# Patient Record
Sex: Male | Born: 1965 | Race: White | Hispanic: No | Marital: Married | Smoking: Current every day smoker
Health system: Southern US, Academic
[De-identification: ages and names within clinical notes are randomized; demographics above are authoritative.]

## PROBLEM LIST (undated history)

## (undated) DIAGNOSIS — E785 Hyperlipidemia, unspecified: Secondary | ICD-10-CM

## (undated) DIAGNOSIS — J449 Chronic obstructive pulmonary disease, unspecified: Secondary | ICD-10-CM

## (undated) DIAGNOSIS — I10 Essential (primary) hypertension: Secondary | ICD-10-CM

## (undated) HISTORY — PX: NO PAST SURGERIES: SHX2092

---

## 2021-11-11 IMAGING — CT LUNG CANCER SCREENING
2 of 4 series · 15 of 36 positions shown, 18 images · non-contrast
Comparison: None available.

﻿EXAM:  LUNG CANCER SCREENING
INDICATION: Lung cancer screening.  Thirty year smoking history.
TECHNIQUE: Low-dose helical noncontrast CT imaging of the chest was performed. Images were reviewed in multiple windows and projections. Exam was performed using 1 or more of the following dose reduction techniques: Automated exposure control, adjustment of the mA and/or kV according to patient size, or the use of iterative reconstruction technique.

[cor · coronal · 0.79mm/px · 3 of 73 slices shown]
[im 15/73  lung]
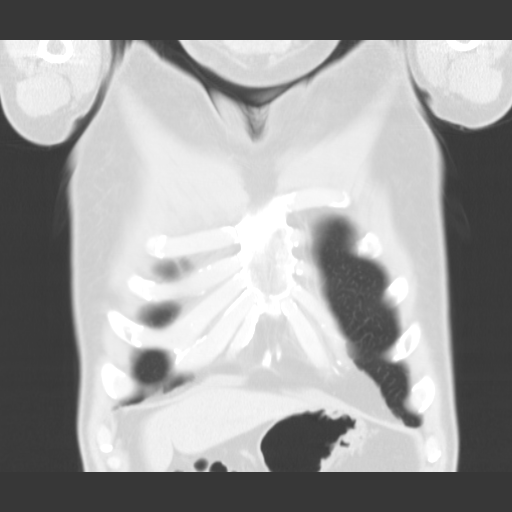
[im 29/73  lung]
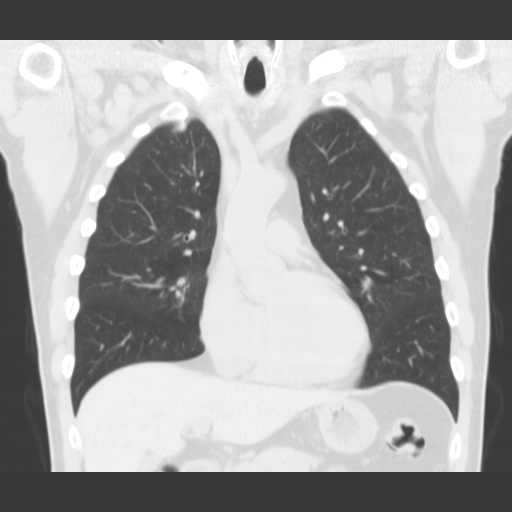
[im 44/73  lung]
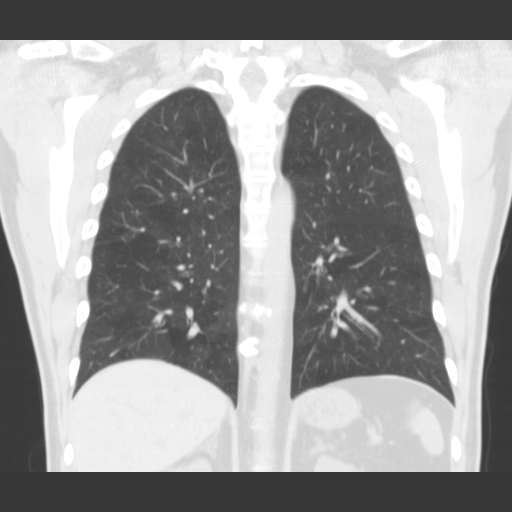

[lung · axial · 0.79mm/px · z∈[-101,+197]mm · 12 of 171 slices shown, 15 images]
[im 11/171  mediastinal]
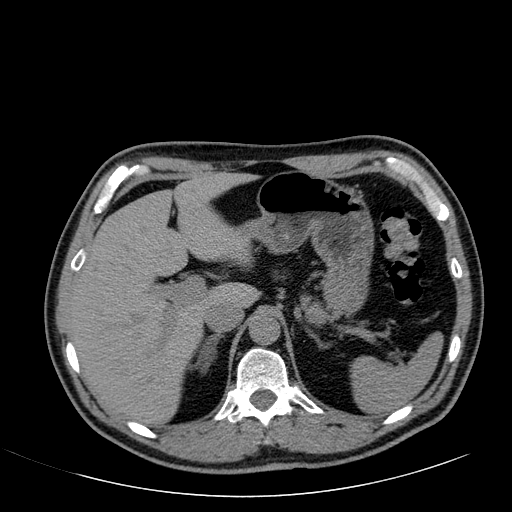
[im 11/171  lung]
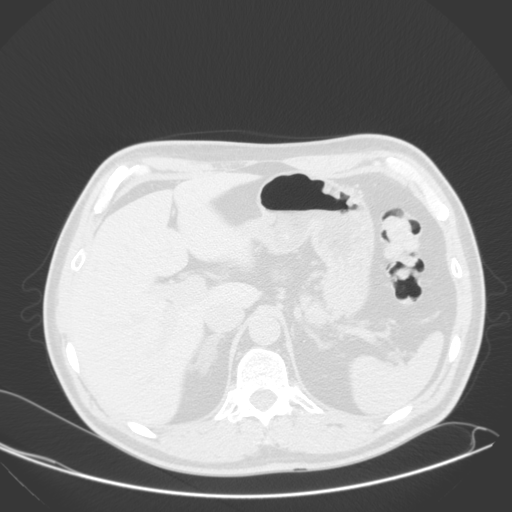
[im 22/171  lung]
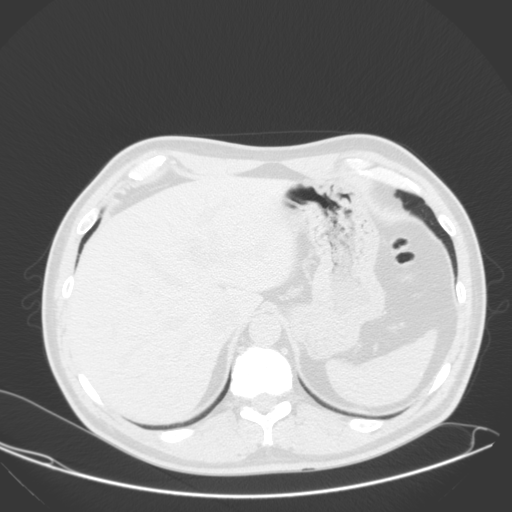
[im 43/171  lung]
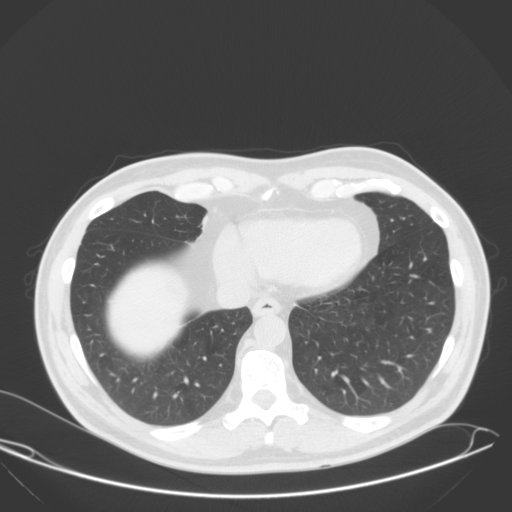
[im 54/171  lung]
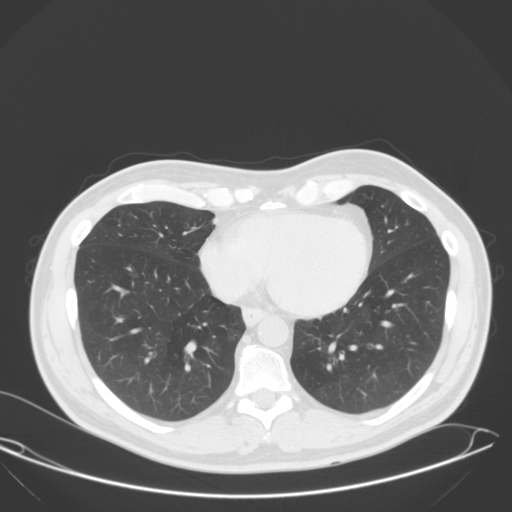
[im 64/171  mediastinal]
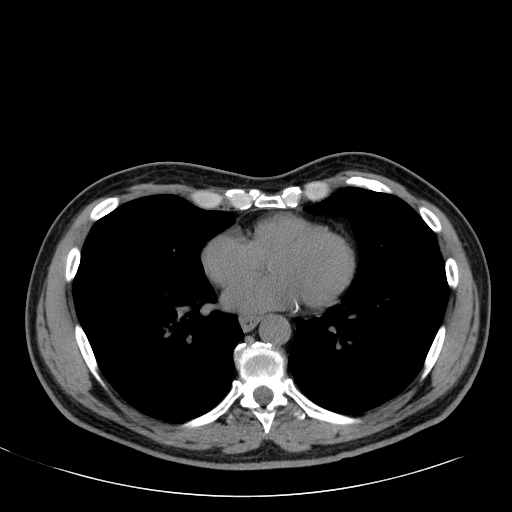
[im 64/171  lung]
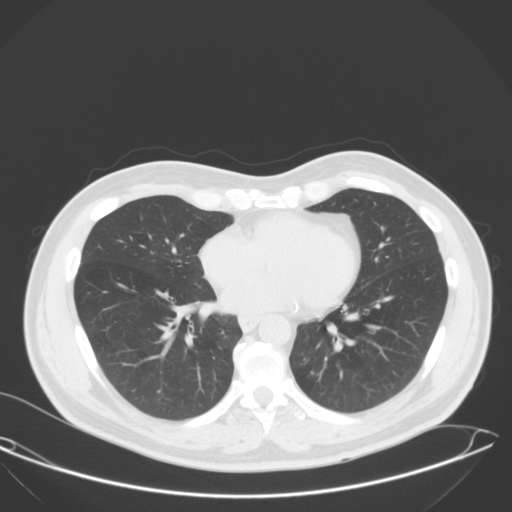
[im 75/171  lung]
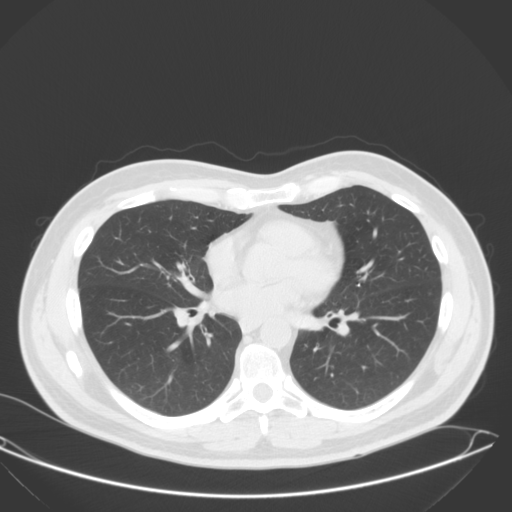
[im 96/171  lung]
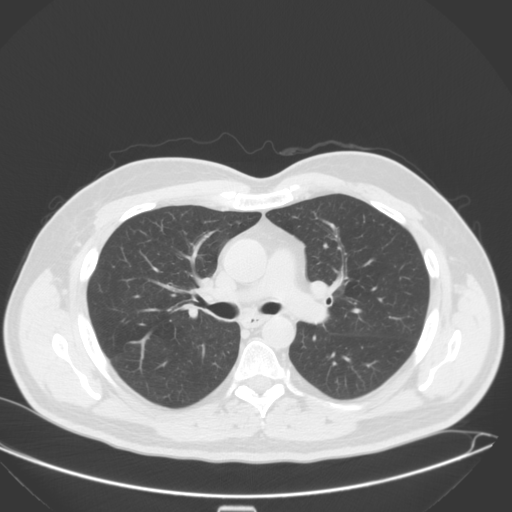
[im 107/171  lung]
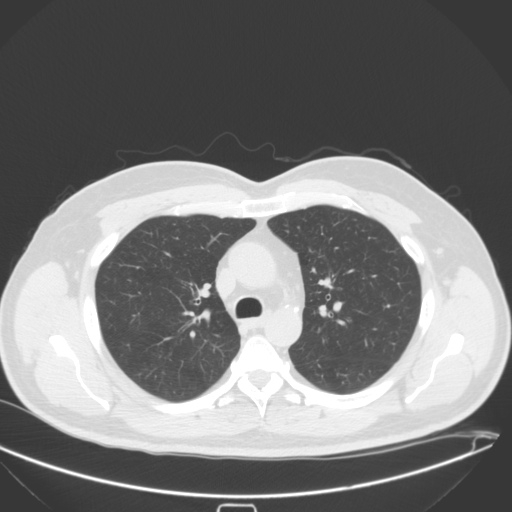
[im 117/171  mediastinal]
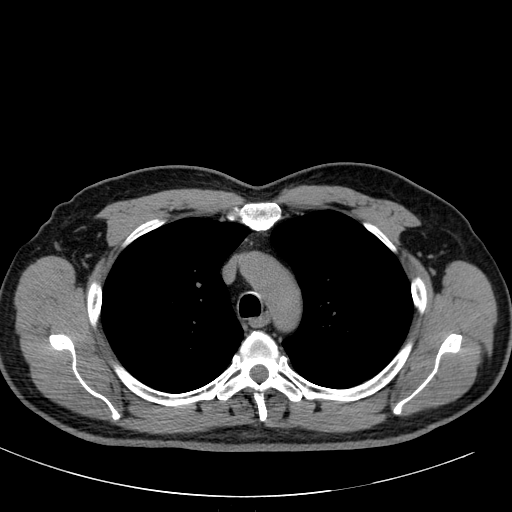
[im 117/171  lung]
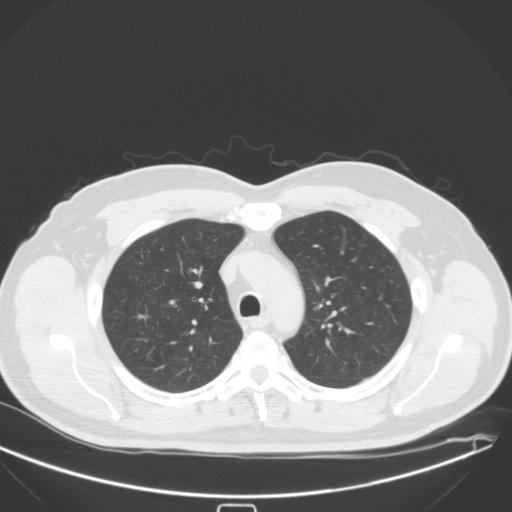
[im 128/171  lung]
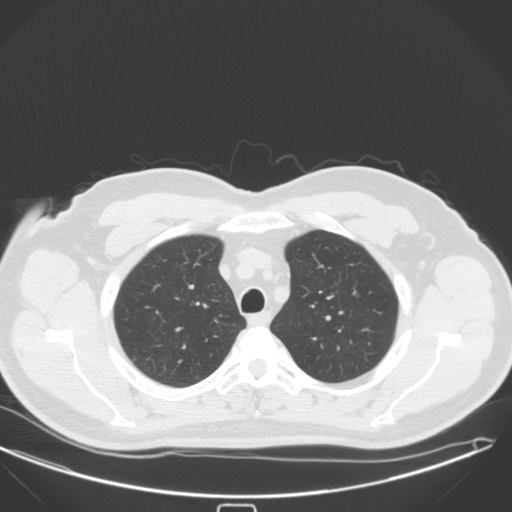
[im 149/171  lung]
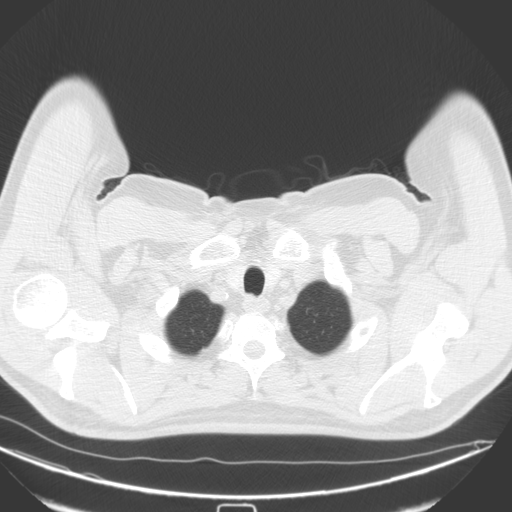
[im 160/171  lung]
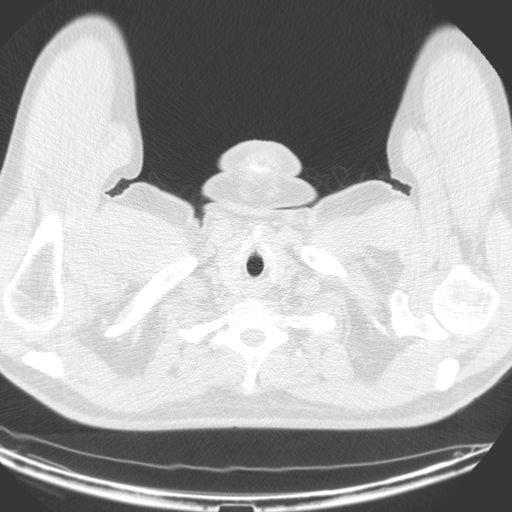

[15 of 36 positions shown; findings below may reference images not displayed]

FINDINGS: Visualized thyroid gland appears unremarkable. Trachea and mainstem bronchi are patent.  There is no mediastinal or axillary adenopathy. Evaluation for hilar adenopathy is limited due to the lack of intravenous contrast.  There are no significant emphysematous changes within the lungs.  There is no suspicious lung nodule.  There is no pleural or pericardial effusion.  There is no lung consolidation.  There is no aortic aneurysm. There are scattered coronary artery calcifications.  There is thickening of the adrenal glands.  Visualized osseous structures are unremarkable.
IMPRESSION: Lung rads 1-negative exam. Repeat low-dose chest CT scan is recommended in 12 months for reassessment.

## 2022-08-02 IMAGING — MR MRI LUMBAR SPINE WITHOUT CONTRAST
5 of 6 series · 31 of 48 positions shown · IV contrast (gadolinium)
Comparison: None available.

﻿EXAM:  [id number]   MRI LUMBAR SPINE WITHOUT CONTRAST
INDICATION: 56-year-old with history of low back pain.  Radicular symptoms to left leg with numbness.  No prior back surgery.
TECHNIQUE: Multiplanar, multisequential MRI of the lumbosacral spine was performed without gadolinium contrast.

[Series 5: T2 · sagittal · 4.0mm · 0.94mm/px · 6 of 15 slices shown (1 of 3)]
[im 1/15]
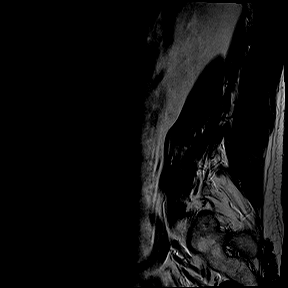
[im 3/15]
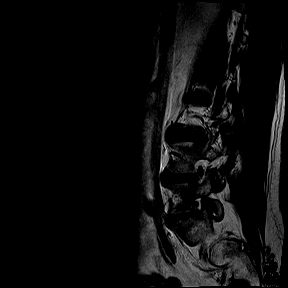
[im 6/15]
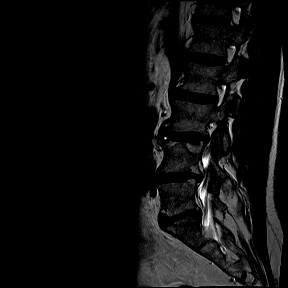
[im 9/15]
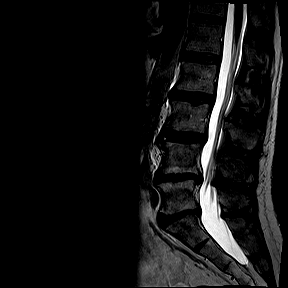
[im 12/15]
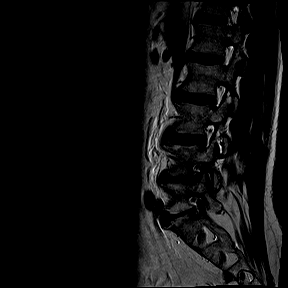
[im 15/15]
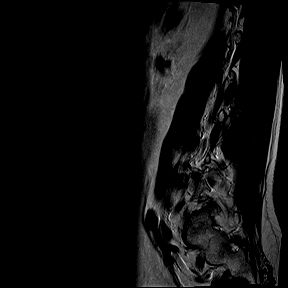

[Series 6: T1 · sagittal · 4.0mm · 0.94mm/px · 7 of 15 slices shown (1 of 2)]
[im 1/15]
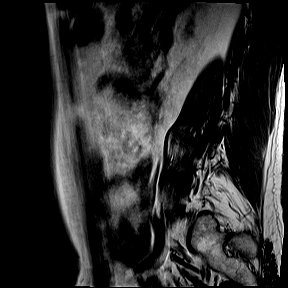
[im 3/15]
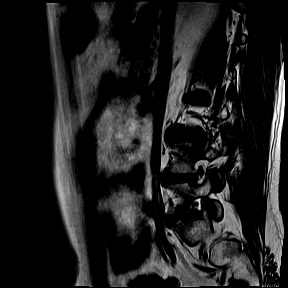
[im 5/15]
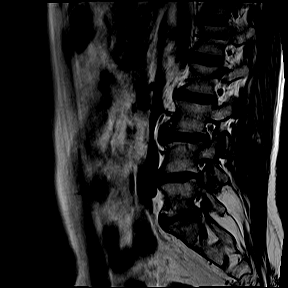
[im 8/15]
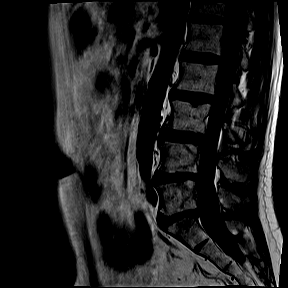
[im 10/15]
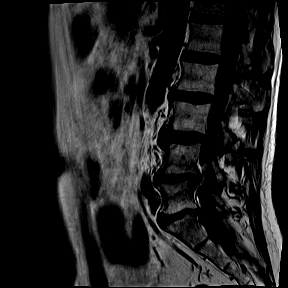
[im 12/15]
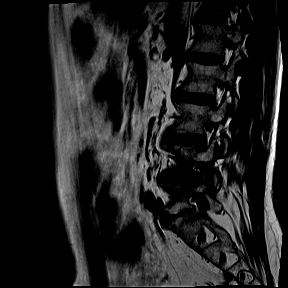
[im 15/15]
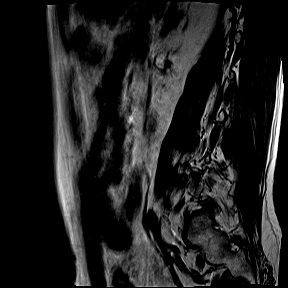

[Series 8: T2 · axial · 4.0mm · 0.52mm/px · z∈[-96,+99]mm · 8 of 23 slices shown (2 of 3)]
[im 1/23]
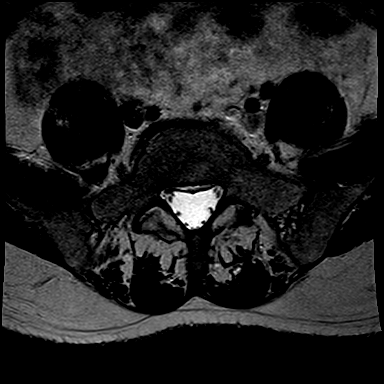
[im 3/23]
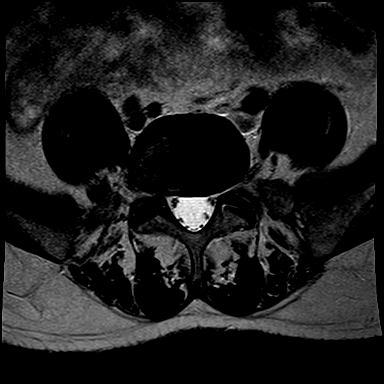
[im 8/23]
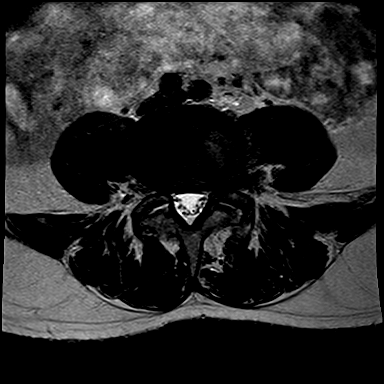
[im 10/23]
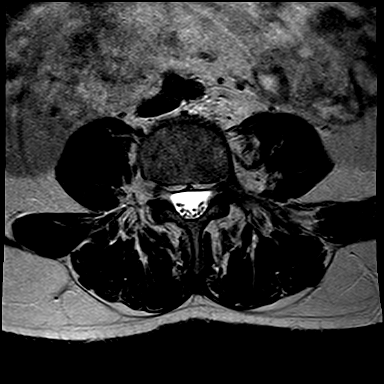
[im 13/23]
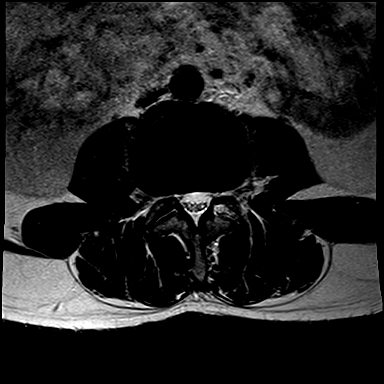
[im 15/23]
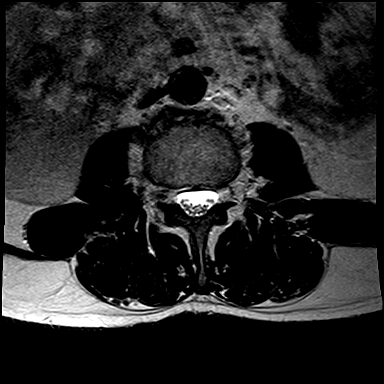
[im 20/23]
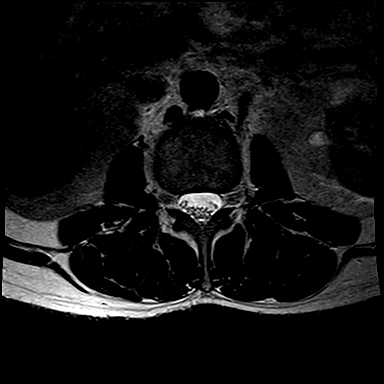
[im 23/23]
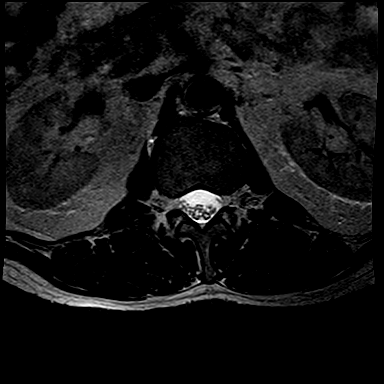

[Series 9: T1 · axial · 4.0mm · 0.52mm/px · z∈[-96,-86]mm · 2 of 23 slices shown (2 of 2)]
[im 1/23]
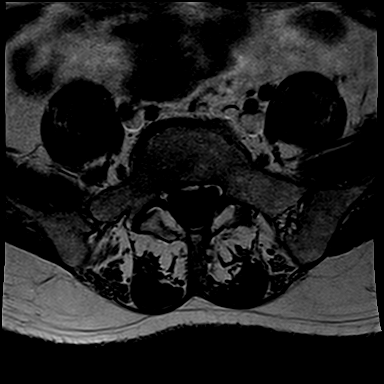
[im 3/23]
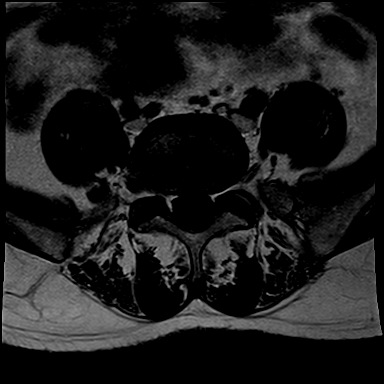

[Series 10: T2 · coronal · 5.0mm · 0.82mm/px · 8 of 18 slices shown (3 of 3)]
[im 1/18]
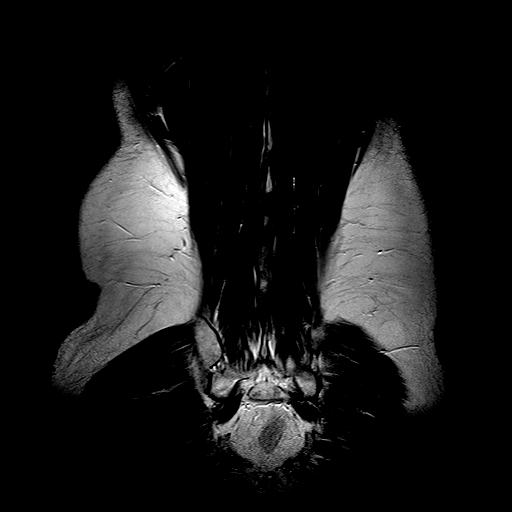
[im 3/18]
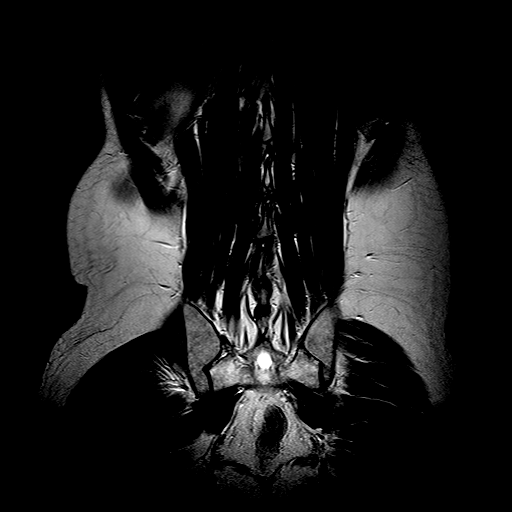
[im 5/18]
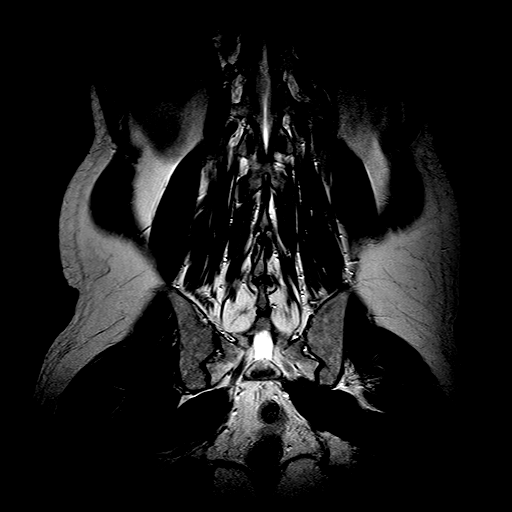
[im 8/18]
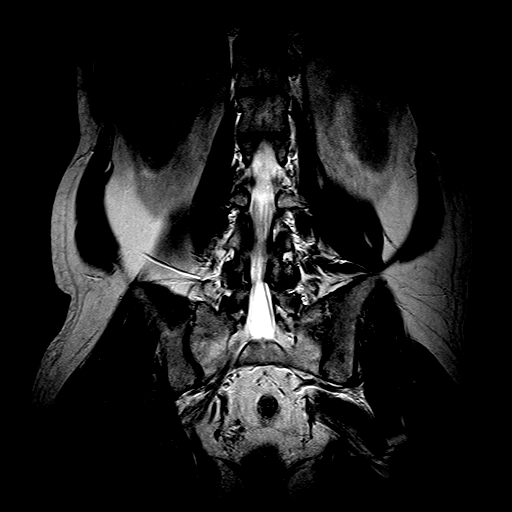
[im 10/18]
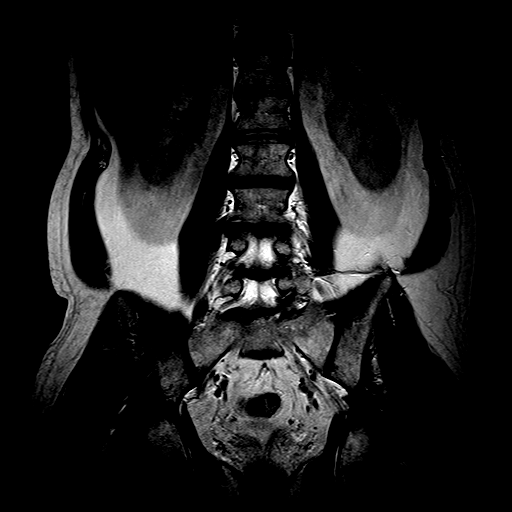
[im 13/18]
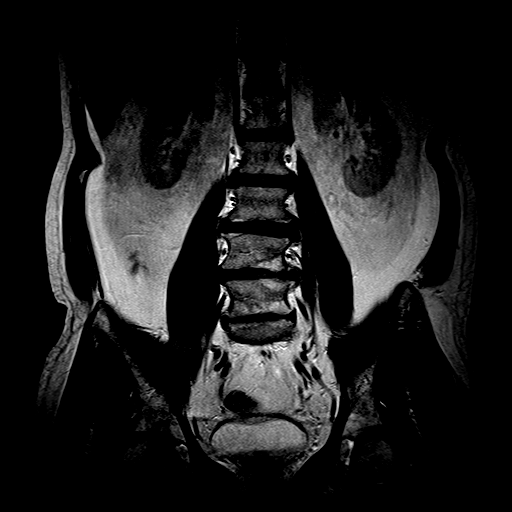
[im 15/18]
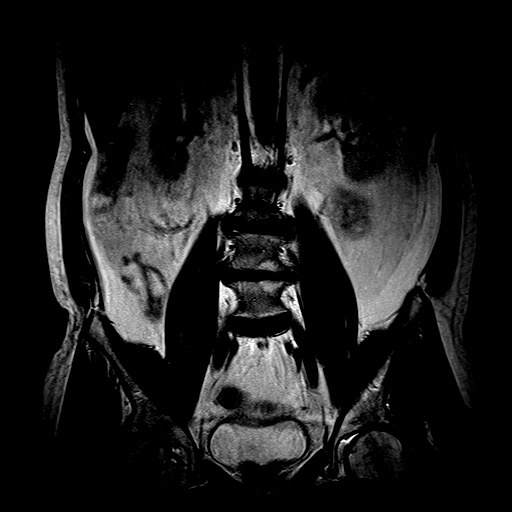
[im 18/18]
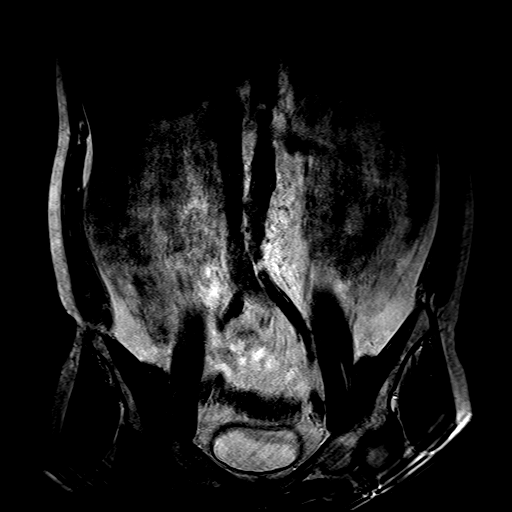

[31 of 48 positions shown; findings below may reference images not displayed]

FINDINGS: No acute bony lesions of lumbar vertebrae are seen.  Lower spinal cord and cauda equina are normal.

L1-2 disc shows no focal lesions.

At L2-3 level, degenerative disc disease and facet arthropathy are causing mild compromise of both lateral recesses.

At L3-4 level, significant bilateral facet arthropathy, left more than the right along with degenerative disc disease with bulging annulus are causing significant compromise of neural foramina, right more than the left and mild compromise of thecal sac in the midline with AP diameter measuring 12.7 mm. 

At L4-5 level, significant facet arthropathy and degenerative disc changes are noted with minimal retrolisthesis.  Significant biforaminal stenosis is noted, left worse than the right.  Mild compromise of thecal sac in the midline with AP diameter measuring 11.7 mm.

At L5-S1 level, degenerative disc disease and facet arthropathy are causing moderately significant biforaminal stenosis. 

Paravertebral soft tissues are unremarkable.
IMPRESSION: 1.  No acute bone changes of lumbar vertebrae.

2.  At L3-4 level, significant bilateral facet arthropathy, left more than the right along with degenerative disc disease with bulging annulus are causing significant compromise of neural foramina, right more than the left and mild compromise of thecal sac in the midline with AP diameter measuring 12.7 mm. 

3. At L4-5 level, significant facet arthropathy and degenerative disc changes are noted with minimal retrolisthesis.  Significant biforaminal stenosis is noted, left worse than the right.  Mild compromise of thecal sac in the midline with AP diameter measuring 11.7 mm.

4. Findings at other disc levels are described above in detail.

Electronically Signed by VIEDEHOF, ELSAMOWAL at [id number] [DATE]

## 2022-12-01 IMAGING — MR MRI SHOULDER RT W/O CONTRAST
6 of 7 series · 29 of 40 positions shown · IV contrast (gadolinium)
Comparison: None available.

﻿EXAM:  [id number]   MRI SHOULDER RT W/O CONTRAST
INDICATION: 56-year-old with persistent right shoulder pain and diminished range of motion.  Sustained trauma loading heavy object.  No prior surgery.
TECHNIQUE: Multiplanar multisequential MRI of the right shoulder was performed without gadolinium contrast.

[Series 6: T1 · oblique · right · 4.0mm · 0.31mm/px · 5 of 22 slices shown]
[im 1/22]
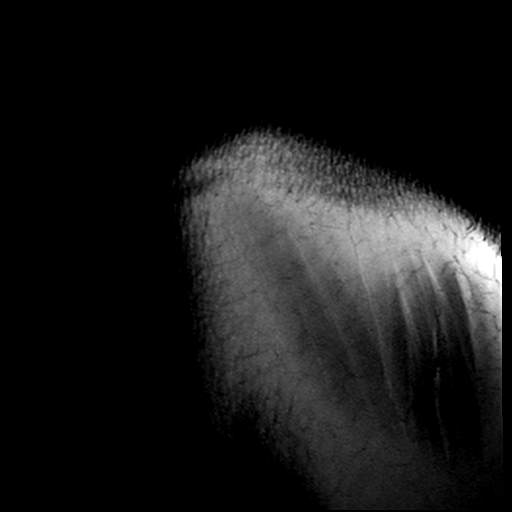
[im 6/22]
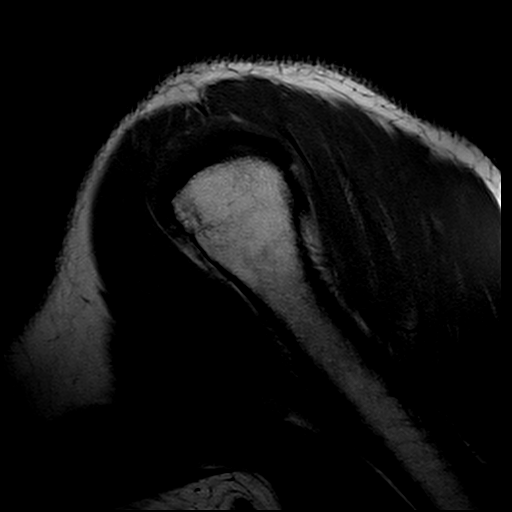
[im 11/22]
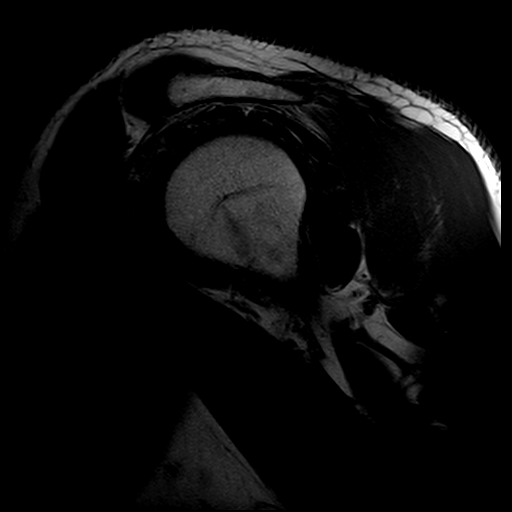
[im 16/22]
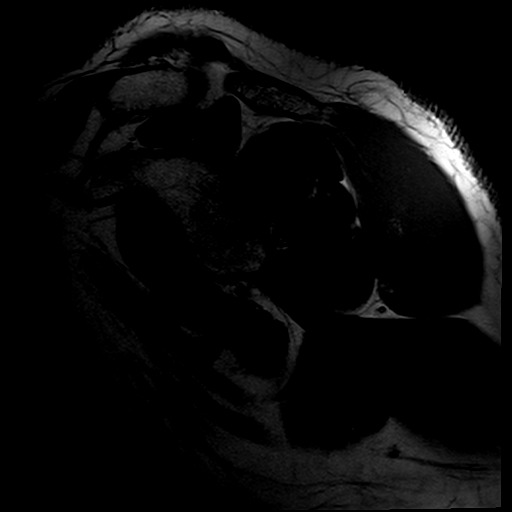
[im 22/22]
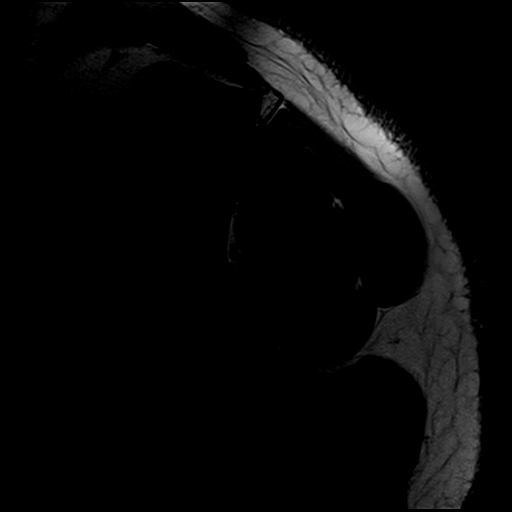

[Series 7: T2 · oblique · right · 4.0mm · 0.42mm/px · 5 of 22 slices shown]
[im 1/22]
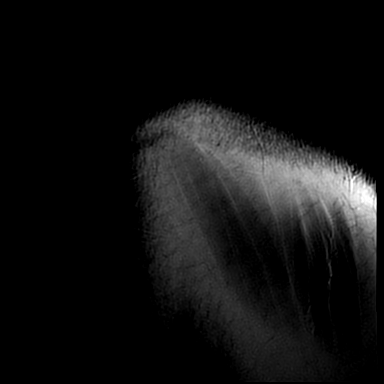
[im 6/22]
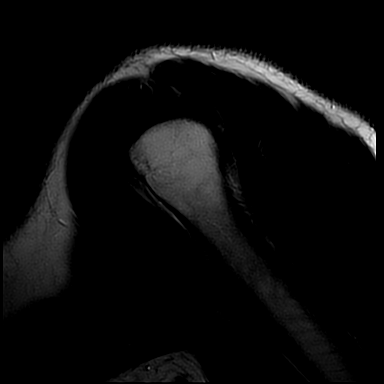
[im 11/22]
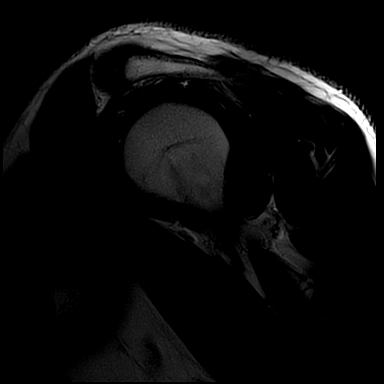
[im 16/22]
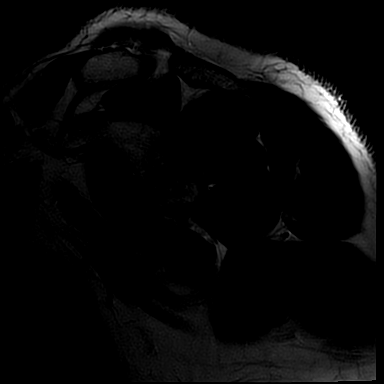
[im 22/22]
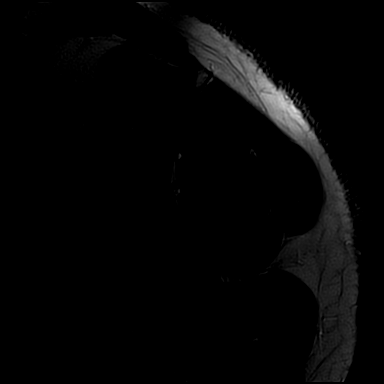

[Series 8: STIR · oblique · right · 4.0mm · 0.36mm/px · 1 of 22 slices shown]
[im 1/22]
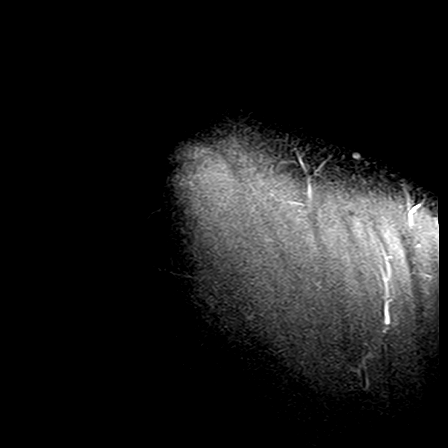

[Series 9: PD fat-sat · axial · right · 4.0mm · 0.36mm/px · z∈[-49,+62]mm · 6 of 24 slices shown (1 of 2)]
[im 1/24]
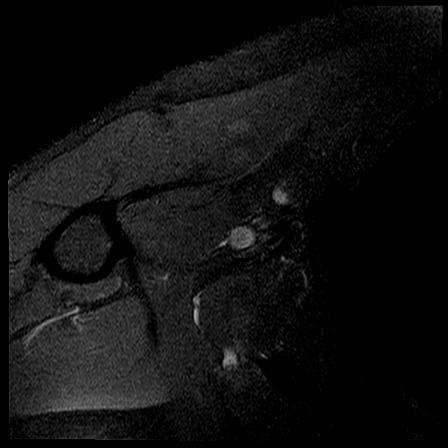
[im 5/24]
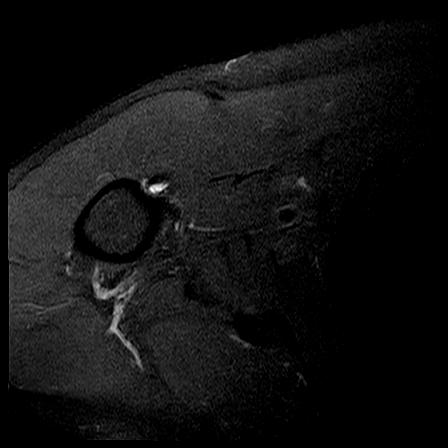
[im 10/24]
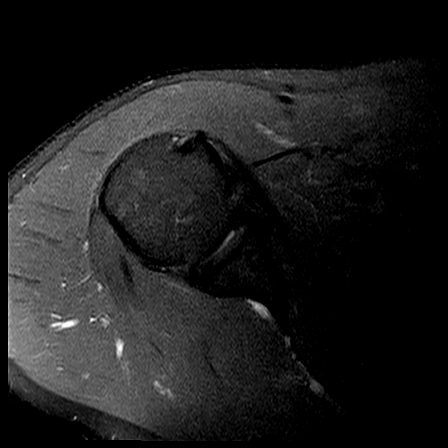
[im 14/24]
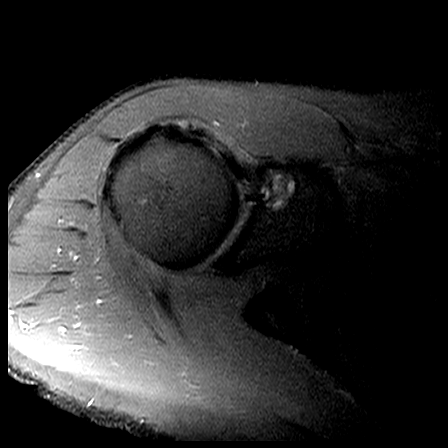
[im 19/24]
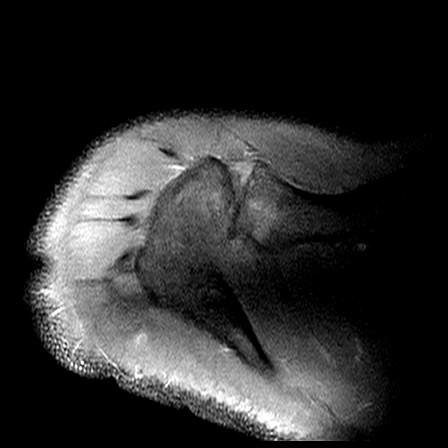
[im 24/24]
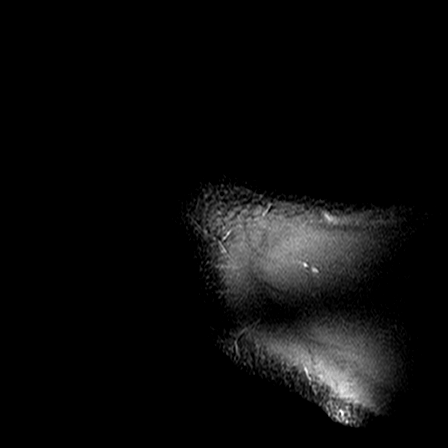

[Series 10: T2 fat-sat · axial · right · 4.0mm · 0.42mm/px · z∈[-49,+62]mm · 6 of 24 slices shown]
[im 1/24]
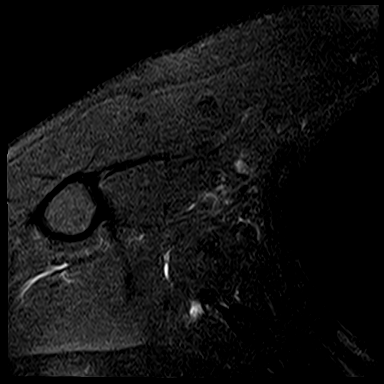
[im 5/24]
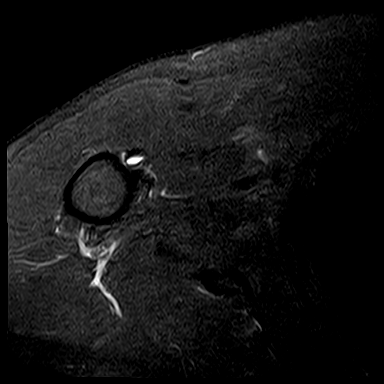
[im 10/24]
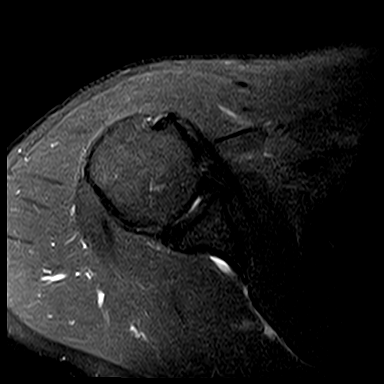
[im 14/24]
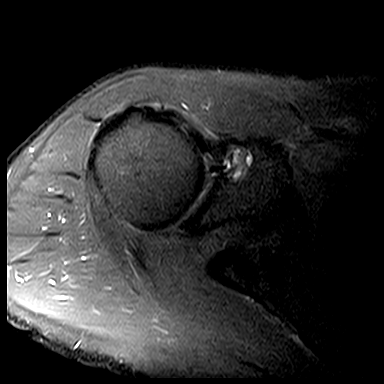
[im 19/24]
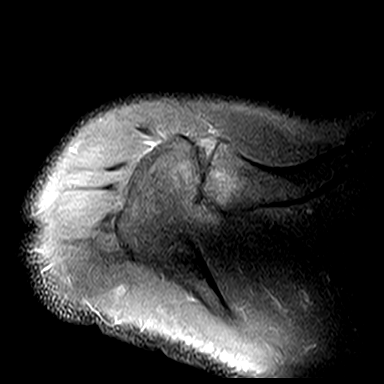
[im 24/24]
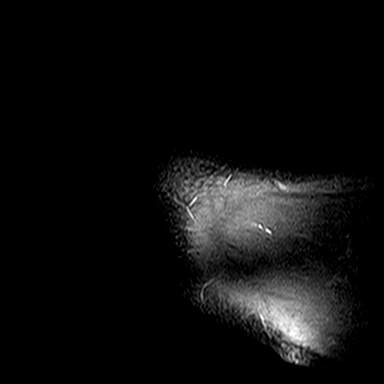

[Series 11: PD fat-sat · oblique · right · 4.0mm · 0.50mm/px · 6 of 23 slices shown (2 of 2)]
[im 1/23]
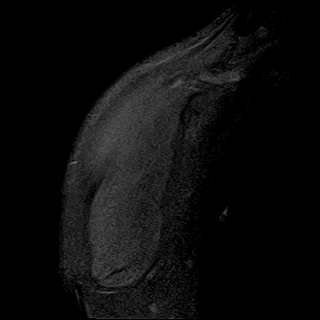
[im 5/23]
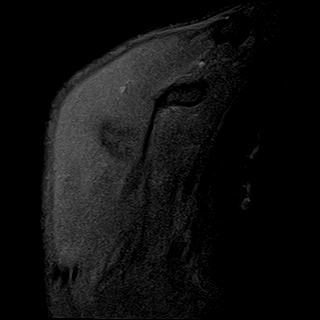
[im 9/23]
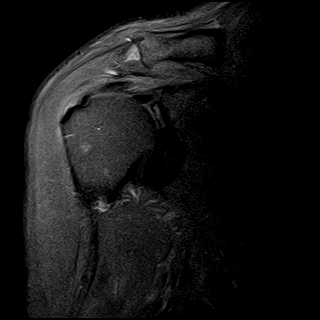
[im 14/23]
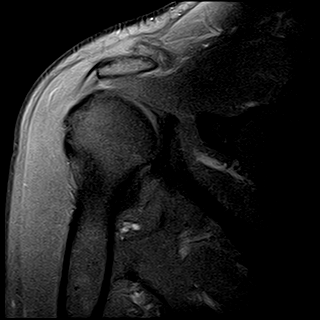
[im 18/23]
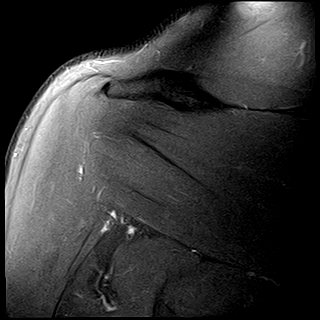
[im 23/23]
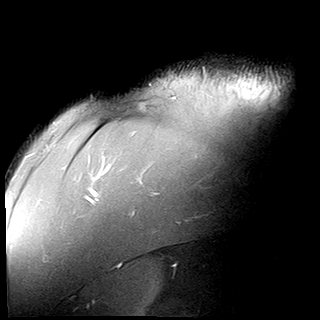

[29 of 40 positions shown; findings below may reference images not displayed]

FINDINGS: No acute bony lesions are seen at the right shoulder.  

The AC joint is mildly impinging on subacromian space and the supraspinatus with mild supraspinatus tendinitis.  No evidence of rotator cuff tear.  A small ganglion cyst at the insertion of supraspinatus into the proximal humerus is noted.    

The anterior-superior lip of the glenoid labrum is showing degenerative change with chronic tear and paralabral cyst 13 mm in diameter.  No displaced fragments are seen.  

Long head of biceps tendon is intact.
IMPRESSION: 1. No acute bone changes at the right shoulder. 

2. Degenerative changes with chronic tear of the anterior-superior lip of the glenoid labrum with paralabral cyst anterior to the anterior lip, 14 mm in diameter.  No displaced fragments are seen.

3. Degenerative changes of AC joint mildly impinging on supraspinatus with mild supraspinatus tendinopathy.  A small ganglion cyst at the insertion of the tendon into the humerus.

## 2022-12-20 ENCOUNTER — Emergency Department
Admission: EM | Admit: 2022-12-20 | Discharge: 2022-12-20 | Disposition: A | Discharge status: Home / Self Care | Payer: BC Managed Care – PPO | Attending: Family | Admitting: Family

## 2022-12-20 ENCOUNTER — Encounter (HOSPITAL_COMMUNITY): Payer: Self-pay

## 2022-12-20 ENCOUNTER — Emergency Department (HOSPITAL_COMMUNITY): Payer: BC Managed Care – PPO

## 2022-12-20 ENCOUNTER — Other Ambulatory Visit: Payer: Self-pay

## 2022-12-20 DIAGNOSIS — R9431 Abnormal electrocardiogram [ECG] [EKG]: Secondary | ICD-10-CM

## 2022-12-20 DIAGNOSIS — R079 Chest pain, unspecified: Secondary | ICD-10-CM | POA: Insufficient documentation

## 2022-12-20 DIAGNOSIS — Z720 Tobacco use: Secondary | ICD-10-CM

## 2022-12-20 HISTORY — DX: Chronic obstructive pulmonary disease, unspecified (CMS HCC): J44.9

## 2022-12-20 HISTORY — DX: Essential (primary) hypertension: I10

## 2022-12-20 HISTORY — DX: Hyperlipidemia, unspecified: E78.5

## 2022-12-20 LAB — CBC WITH DIFF
BASOPHIL #: 0.1 10*3/uL (ref 0.00–0.10)
BASOPHIL %: 1 % (ref 0–1)
EOSINOPHIL #: 0.1 10*3/uL (ref 0.00–0.50)
EOSINOPHIL %: 2 %
HCT: 45.7 % (ref 36.7–47.1)
HGB: 16 g/dL (ref 12.5–16.3)
LYMPHOCYTE #: 2.1 10*3/uL (ref 1.00–3.00)
LYMPHOCYTE %: 25 % (ref 16–44)
MCH: 32 pg (ref 23.8–33.4)
MCHC: 34.9 g/dL (ref 32.5–36.3)
MCV: 91.5 fL (ref 73.0–96.2)
MONOCYTE #: 0.8 10*3/uL (ref 0.30–1.00)
MONOCYTE %: 10 % (ref 5–13)
MPV: 7.4 fL (ref 7.4–11.4)
NEUTROPHIL #: 5.3 10*3/uL (ref 1.85–7.80)
NEUTROPHIL %: 63 % (ref 43–77)
PLATELETS: 218 10*3/uL (ref 140–440)
RBC: 5 10*6/uL (ref 4.06–5.63)
RDW: 13.2 % (ref 12.1–16.2)
WBC: 8.4 10*3/uL (ref 3.6–10.2)

## 2022-12-20 LAB — TROPONIN-I
TROPONIN I: 3 ng/L (ref <20)
TROPONIN I: 3 ng/L (ref <20)

## 2022-12-20 LAB — COMPREHENSIVE METABOLIC PANEL, NON-FASTING
ALBUMIN/GLOBULIN RATIO: 1.9 — ABNORMAL HIGH (ref 0.8–1.4)
ALBUMIN: 4.7 g/dL (ref 3.5–5.7)
ALKALINE PHOSPHATASE: 50 U/L (ref 34–104)
ALT (SGPT): 14 U/L (ref 7–52)
ANION GAP: 8 mmol/L (ref 4–13)
AST (SGOT): 18 U/L (ref 13–39)
BILIRUBIN TOTAL: 0.8 mg/dL (ref 0.3–1.2)
BUN/CREA RATIO: 11 (ref 6–22)
BUN: 12 mg/dL (ref 7–25)
CALCIUM, CORRECTED: 9.2 mg/dL (ref 8.9–10.8)
CALCIUM: 9.8 mg/dL (ref 8.6–10.3)
CHLORIDE: 104 mmol/L (ref 98–107)
CO2 TOTAL: 23 mmol/L (ref 21–31)
CREATININE: 1.11 mg/dL (ref 0.60–1.30)
ESTIMATED GFR: 78 mL/min/{1.73_m2} (ref >59)
GLOBULIN: 2.5 — ABNORMAL LOW (ref 2.9–5.4)
GLUCOSE: 103 mg/dL (ref 74–109)
OSMOLALITY, CALCULATED: 270 mOsm/kg (ref 270–290)
POTASSIUM: 3.9 mmol/L (ref 3.5–5.1)
PROTEIN TOTAL: 7.2 g/dL (ref 6.4–8.9)
SODIUM: 135 mmol/L — ABNORMAL LOW (ref 136–145)

## 2022-12-20 LAB — GOLD TOP TUBE

## 2022-12-20 LAB — BLUE TOP TUBE

## 2022-12-20 LAB — GRAY TOP TUBE

## 2022-12-20 LAB — THYROID STIMULATING HORMONE (SENSITIVE TSH): TSH: 4.476 u[IU]/mL (ref 0.450–5.330)

## 2022-12-20 LAB — MAGNESIUM: MAGNESIUM: 2.2 mg/dL (ref 1.9–2.7)

## 2022-12-20 NOTE — ED Provider Notes (Signed)
[facility]  ED Primary Provider Note  History of Present Illness   Chief Complaint   Patient presents with    Nausea    Abnormal Radiology Test     Ian Robertson is a 57 y.o. male who had concerns including Nausea and Abnormal Radiology Test.  Arrival: The patient arrived by Car    This is a 57 year old male patient who presents to the emergency room with complaints of chest pain and palpitations.  Patient states that he had a sudden onset of pain that initially started in his left leg that radiated into his chest.  He describes it as a sharp stabbing pain in his left groin that radiated into his chest.  He states that he got very shaky, short of breath and felt very nervous.  He felt like he was going to throw up but was not able to vomit.  He denies actual chest pain.  He states he felt more palpitations.  He states this sensation lasted for approximately 15 minutes persistently.  There was no diaphoresis or vomiting.  His wife states the pain was intense enough for him to call her.  He denies previous history of coronary artery disease.  He had a stress test more than 20 years ago that he reports as negative.  He has a positive family history of coronary artery disease in his mother who had 4 vessel CABG in her early 75s.  He does smoke 1 pack of cigarettes a day from 1 in 50 years.  He does have a history of hypertension.  He denies missing any doses of medication.  Chest pain is not exacerbated by exertion.  Afebrile.  Vital signs stable.  Oxygen saturation 98% on room air.  He denies any injury or trauma to his left lower extremity.      Nausea  Associated symptoms: nausea      History Reviewed This Encounter:      Physical Exam   ED Triage Vitals [12/20/22 1416]   BP (Non-Invasive) (!) 155/83   Heart Rate 87   Respiratory Rate 18   Temperature 36.4 C (97.6 F)   SpO2 100 %   Weight 95.3 kg (210 lb)   Height 1.88 m (6\' 2" )     Physical Exam  Vitals and nursing note reviewed.  Exam conducted with a chaperone present.   Constitutional:       Appearance: Normal appearance. He is obese.   HENT:      Head: Normocephalic and atraumatic.      Right Ear: Ear canal and external ear normal.      Left Ear: Ear canal and external ear normal.      Nose: Nose normal.      Mouth/Throat:      Mouth: Mucous membranes are moist.      Pharynx: Oropharynx is clear.   Eyes:      Extraocular Movements: Extraocular movements intact.      Conjunctiva/sclera: Conjunctivae normal.      Pupils: Pupils are equal, round, and reactive to light.   Cardiovascular:      Rate and Rhythm: Normal rate and regular rhythm.      Pulses: Normal pulses.      Heart sounds: Normal heart sounds.   Pulmonary:      Effort: Pulmonary effort is normal.      Breath sounds: Normal breath sounds.   Abdominal:      General: Bowel sounds are normal.  Palpations: Abdomen is soft.   Musculoskeletal:         General: No swelling, tenderness, deformity or signs of injury. Normal range of motion.      Cervical back: Normal range of motion and neck supple.      Right lower leg: No edema.      Left lower leg: No edema.   Skin:     General: Skin is warm and dry.      Capillary Refill: Capillary refill takes less than 2 seconds.   Neurological:      General: No focal deficit present.      Mental Status: He is alert and oriented to person, place, and time.   Psychiatric:         Mood and Affect: Mood normal.         Behavior: Behavior normal.       Patient Data     Labs Ordered/Reviewed   COMPREHENSIVE METABOLIC PANEL, NON-FASTING - Abnormal; Notable for the following components:       Result Value    SODIUM 135 (*)     ALBUMIN/GLOBULIN RATIO 1.9 (*)     GLOBULIN 2.5 (*)     All other components within normal limits    Narrative:     Estimated Glomerular Filtration Rate (eGFR) is calculated using the CKD-EPI (2021) equation, intended for patients 66 years of age and older. If gender is not documented or "unknown", there will be no eGFR  calculation.     MAGNESIUM - Normal   THYROID STIMULATING HORMONE (SENSITIVE TSH) - Normal   TROPONIN-I - Normal   TROPONIN-I - Normal   CBC WITH DIFF - Normal   CBC/DIFF    Narrative:     The following orders were created for panel order CBC/DIFF.  Procedure                               Abnormality         Status                     ---------                               -----------         ------                     CBC WITH [id number]                Normal              Final result                 Please view results for these tests on the individual orders.   TROPONIN-I   EXTRA TUBES    Narrative:     The following orders were created for panel order EXTRA TUBES.  Procedure                               Abnormality         Status                     ---------                               -----------         ------  BLUE TOP [id number]                                    In process                 GOLD TOP [id number]                                    In process                 [place]                                    In process                   Please view results for these tests on the individual orders.   BLUE TOP TUBE   GOLD TOP TUBE   GRAY TOP TUBE     No orders to display     Medical Decision Making        Medical Decision Making  This is a 57 year old male patient who presents to the emergency room with complaints of chest pain and palpitations.  Patient states that he had a sudden onset of pain that initially started in his left leg that radiated into his chest.  He describes it as a sharp stabbing pain in his left groin that radiated into his chest.  He states that he got very shaky, short of breath and felt very nervous.  He felt like he was going to throw up but was not able to vomit.  He denies actual chest pain.  He states he felt more palpitations.  He states this sensation lasted for approximately 15 minutes persistently.  There was no  diaphoresis or vomiting.  Sodium 137 potassium 3.9 chloride 104 CO2 23 BUN 12 creatinine 1.11 calcium 9.8 glucose 103 alk-phos 50 ALT 14 AST 18 total bilirubin 0.8 total protein 7.2 WBC 8.4 hemoglobin 16.0 hematocrit 45.7 platelets 218 magnesium 2.2 TSH 4.476 EKG Sinus Rhythm HR 74 nonspecific st t wave changes. Qtc 82.  Troponin negative x2.  Patient is medically stable to be discharged home.  Lengthy discussion with patient regarding aggressive risk factor modifications.  Patient counseled on smoking cessation.  He is to check his blood pressure twice daily keep a blood pressure log.  He is to take his blood pressure log to follow up with his primary care provider.  He would benefit from an ischemic workup which can be arranged by his primary care provider.  He is to follow up with his primary care provider in 1 week or sooner if needed.  He is to return to the emergency room if he has any worsening symptoms.      Amount and/or Complexity of Data Reviewed  ECG/medicine tests: ordered.                Clinical Impression   Chest pain, unspecified type (Primary)       Disposition: Discharged

## 2022-12-20 NOTE — ED APP Handoff Note (Signed)
[facility]  Emergency Department  Provider in Triage Note    Name: Ian Robertson  Age: 57 y.o.  Gender: male     Subjective:   Ian Robertson is a 57 y.o. male who presents with complaint of Nausea and Abnormal Radiology Test  .  57 year old male arrives today due to abnormal EKG.  Patient endorses nausea.  Objective:   Filed Vitals:    12/20/22 1416   BP: (!) 155/83   Pulse: 87   Resp: 18   Temp: 36.4 C (97.6 F)   SpO2: 100%      Focused Physical Exam shows alert and oriented x4.    Assessment:  A medical screening exam was completed.  This patient is a 57 y.o. male with initial findings showing no acute findings.    Plan:  Please see initial orders and work-up below.  This is to be continued with full evaluation in the main Emergency Department.     No current facility-administered medications for this encounter.     Results for orders placed or performed during the hospital encounter of 12/20/22 (from the past 24 hour(s))   CBC/DIFF    Narrative    The following orders were created for panel order CBC/DIFF.  Procedure                               Abnormality         Status                     ---------                               -----------         ------                     CBC WITH [id number]                                                                 Please view results for these tests on the individual orders.        Sandria Manly, PA-C  12/20/2022, 14:22

## 2022-12-20 NOTE — ED Triage Notes (Signed)
C/O NAUSEA. WENT TO PCP AND SENT HERE FOR ABNORMAL EKG.

## 2022-12-20 NOTE — ED Nurses Note (Signed)
Patient discharged home with family.  AVS reviewed with patient.  A written copy of the AVS and discharge instructions was given to the patient.  Questions sufficiently answered as needed.  Patient encouraged to follow up with PCP as indicated.  In the event of an emergency, patient instructed to call 911 or go to the nearest emergency room.

## 2022-12-20 NOTE — ED Nurses Note (Signed)
Patient to ER 14 via triage c/o epigastric pain and nausea. Patient reports s/s started this morning. Patient was being seen at [facility] office and sent here for abnormal EKG. Patient is awake, A&O x 4. NAD noted. Patient placed in gown and on bedside monitor (cardiac, BP, and pulse ox). VSS. Family at bedside. Call light within reach.

## 2022-12-20 NOTE — Discharge Instructions (Addendum)
Check blood pressure twice daily keep blood pressure log.  Take blood pressure log to follow up with PCP.  Aggressive cardiovascular risk factor modifications.  Patient counseled on smoking cessation.  Follow up with primary care provider in 1 week or sooner if needed.  Return to ER for worsening symptoms.

## 2022-12-23 DIAGNOSIS — J849 Interstitial pulmonary disease, unspecified: Secondary | ICD-10-CM

## 2022-12-23 DIAGNOSIS — I517 Cardiomegaly: Secondary | ICD-10-CM

## 2022-12-23 DIAGNOSIS — I498 Other specified cardiac arrhythmias: Secondary | ICD-10-CM

## 2022-12-23 LAB — ECG 12 LEAD
Atrial Rate: 74 {beats}/min
Calculated P Axis: 81 degrees
Calculated R Axis: -40 degrees
Calculated T Axis: 93 degrees
PR Interval: 116 ms
QRS Duration: 82 ms
QT Interval: 372 ms
QTC Calculation: 412 ms
Ventricular rate: 74 {beats}/min

## 2022-12-26 IMAGING — CT LUNG CANCER SCREENING
2 of 4 series · 15 of 36 positions shown, 18 images · non-contrast
Comparison: CT chest 11/11/2021.

﻿EXAM:  LUNG CANCER SCREENING
INDICATION: 35+ year history of smoking.  Follow-up.
TECHNIQUE: CT was performed without contrast and reviewed in multiple projections. Radiation dose 608 mGy cm.  Images were reviewed in multiple windows and projections. Exam was performed using 1 or more of the following dose reduction techniques: Automated exposure control, adjustment of the mA and/or kV according to patient size, or the use of iterative reconstruction technique.

[Series 8029: cor · coronal · 0.83mm/px · 3 of 75 slices shown]
[im 15/75  lung]
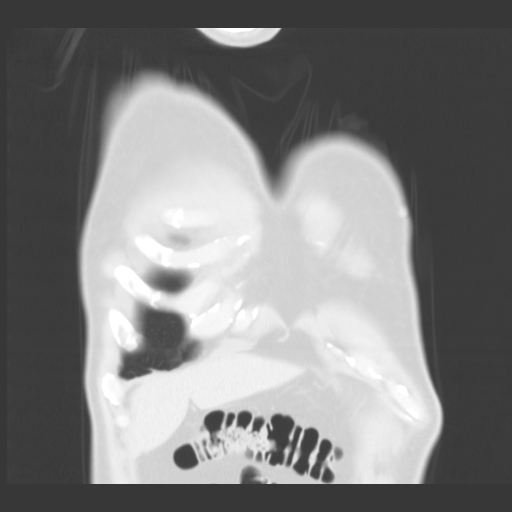
[im 30/75  lung]
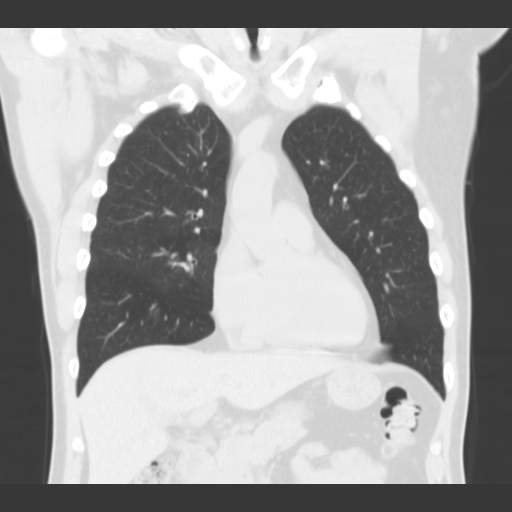
[im 45/75  lung]
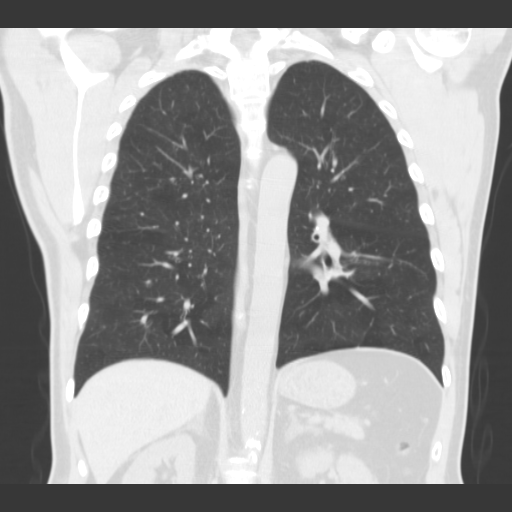

[lung · axial · 0.83mm/px · z∈[-535,-203]mm · 12 of 190 slices shown, 15 images]
[im 12/190  mediastinal]
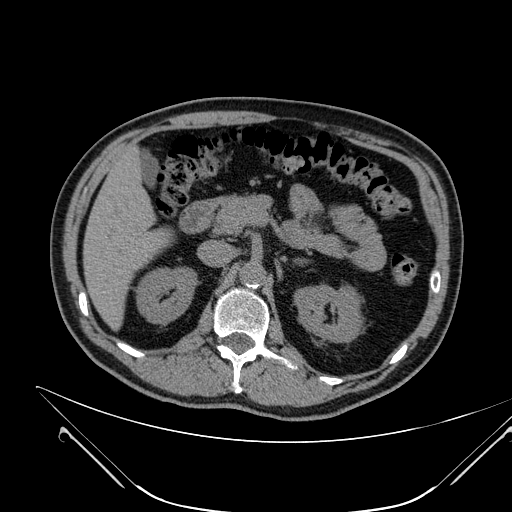
[im 12/190  lung]
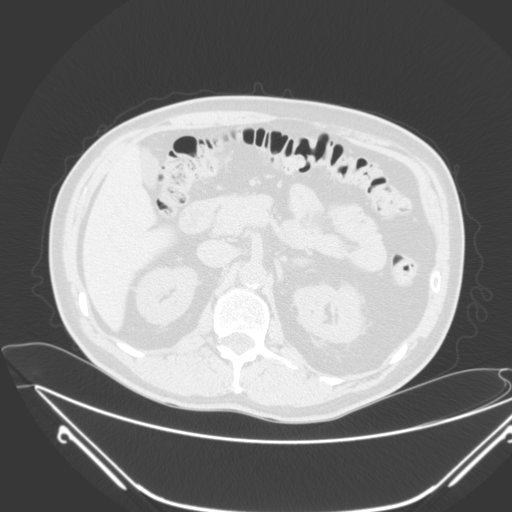
[im 24/190  lung]
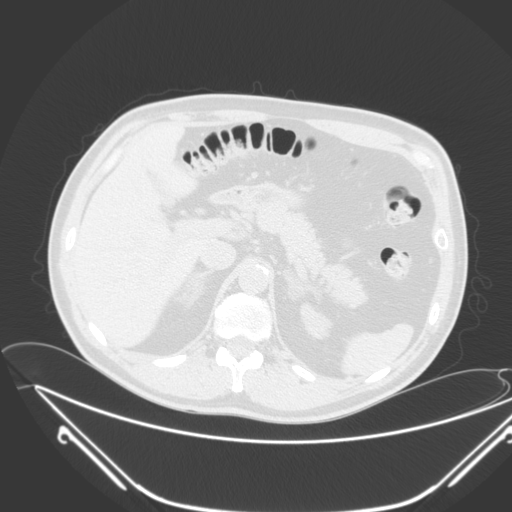
[im 48/190  lung]
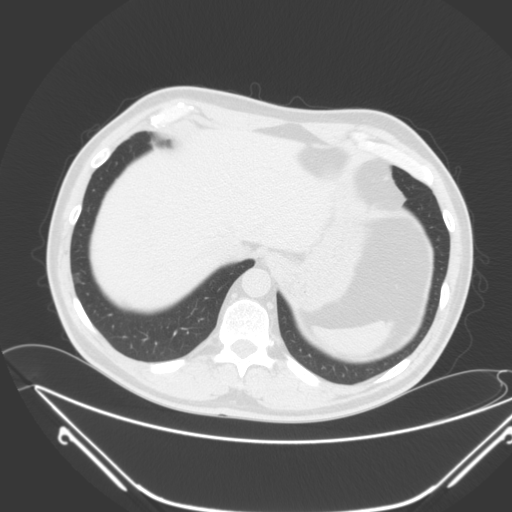
[im 60/190  lung]
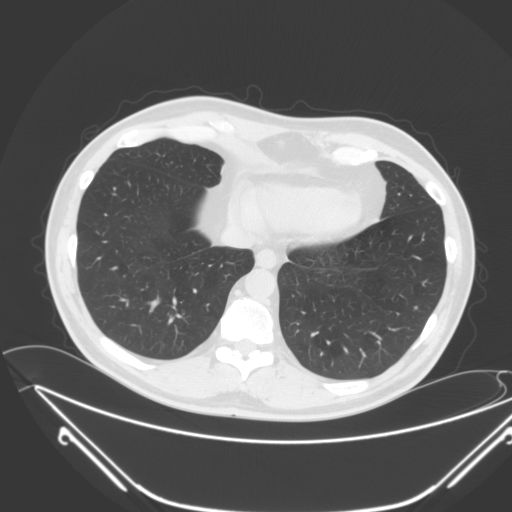
[im 71/190  mediastinal]
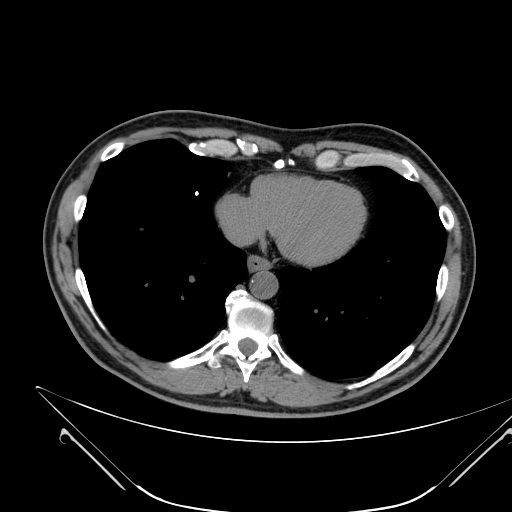
[im 71/190  lung]
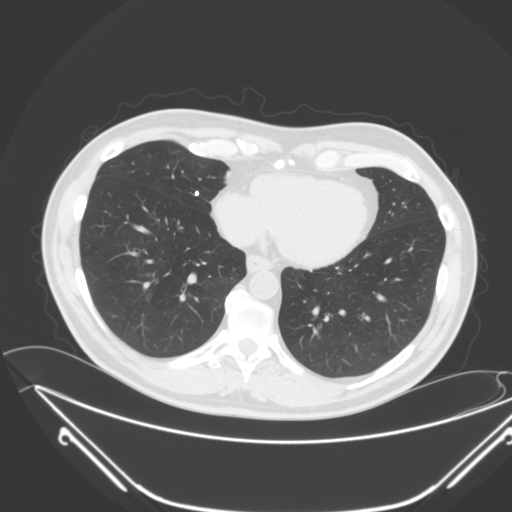
[im 83/190  lung]
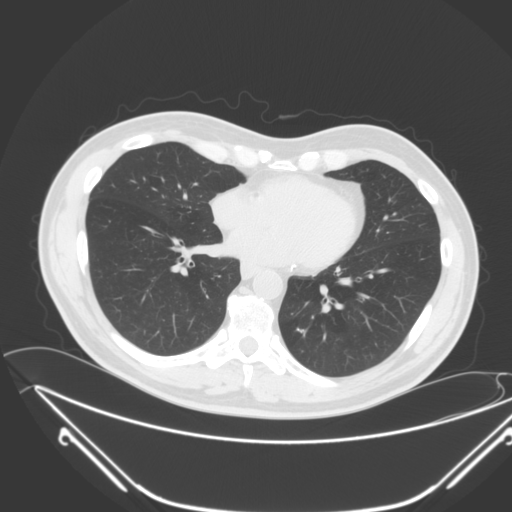
[im 107/190  lung]
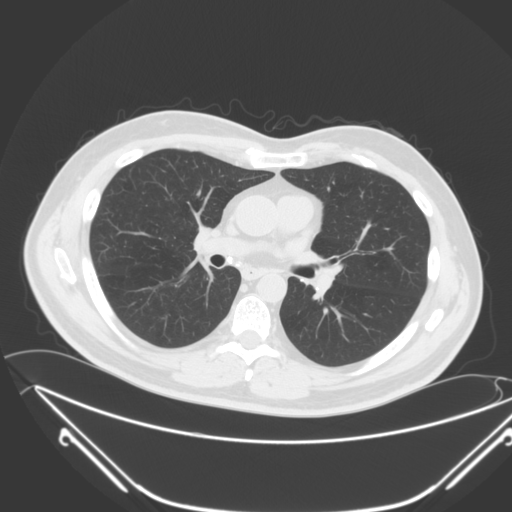
[im 119/190  lung]
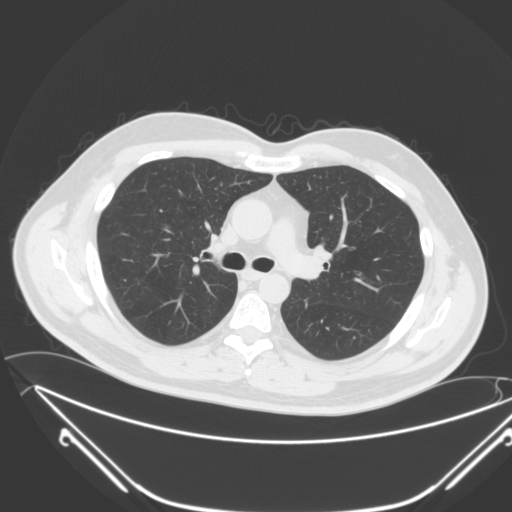
[im 130/190  mediastinal]
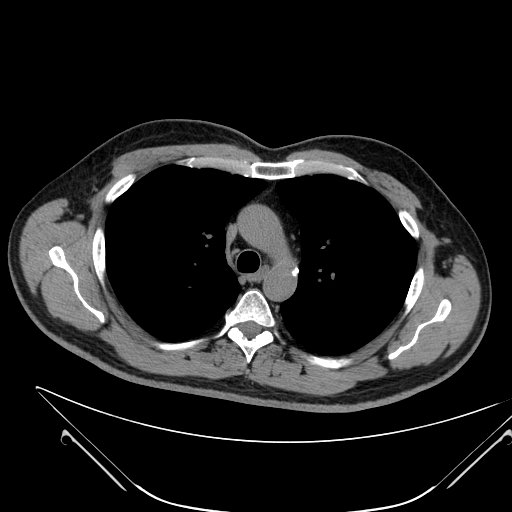
[im 130/190  lung]
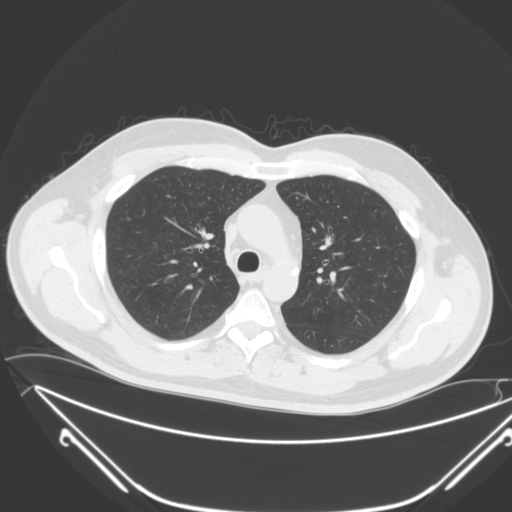
[im 142/190  lung]
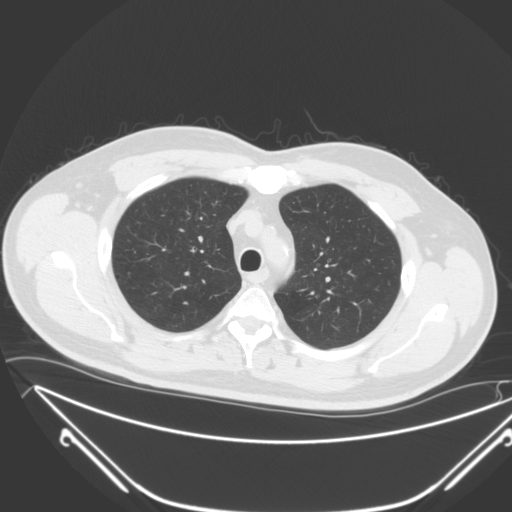
[im 166/190  lung]
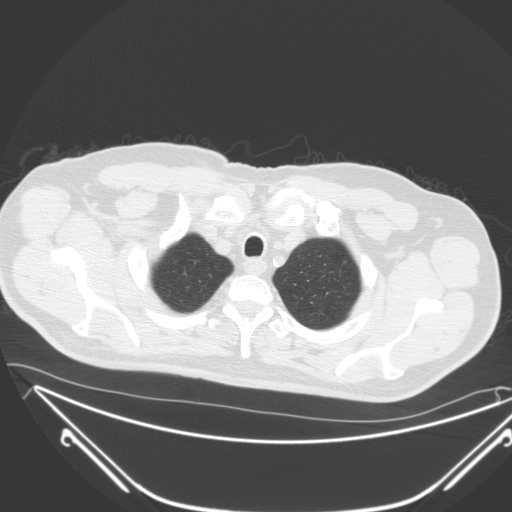
[im 178/190  lung]
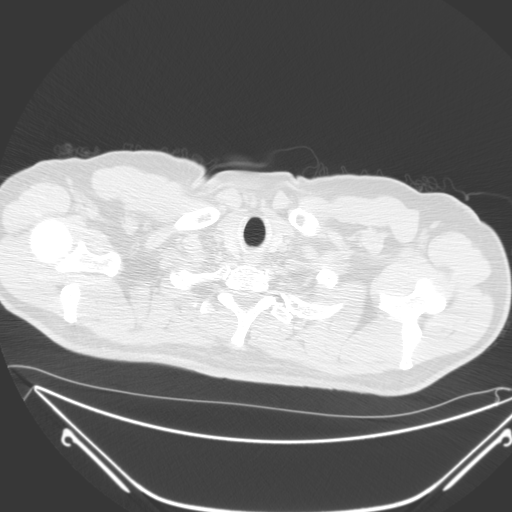

[15 of 36 positions shown; findings below may reference images not displayed]

FINDINGS: Mild centrilobular pattern of emphysema of lungs.  No pulmonary mass or nodules are seen other than benign calcified granuloma in the right middle lobe.  No hilar or mediastinal adenopathy other than stable benign calcified nodes of the subcarinal mediastinum. Significant coronary artery calcification of left main and anterior descending coronary arteries.  No effusion.
IMPRESSION: 1. Emphysematous changes of lungs of mild degree. Stable calcified granulomatous nodules of both lungs.  Continue annual low-dose screening. Lung-RADS Category 2

2. Coronary artery calcification involving left main and anterior descending coronary artery.

3. Stable calcified subcarinal mediastinal adenopathy.  

Lung-RADS Categories:

0-Incomplete Exam

1-Negative <1% chance of malignancy

2-Benign appearance or behavior, <1% chance of malignancy

3-Probably benign, 1-2% chance of malignancy

86-Kuspicious, 5-15% chance of malignancy

2G-Wery Suspicious, >15% chance of malignancy
